# Patient Record
Sex: Male | Born: 1980 | Race: White | Hispanic: No | Smoking: Current every day smoker
Health system: Southern US, Community
[De-identification: ages and names within clinical notes are randomized; demographics above are authoritative.]

## PROBLEM LIST (undated history)

## (undated) DIAGNOSIS — E785 Hyperlipidemia, unspecified: Secondary | ICD-10-CM

## (undated) DIAGNOSIS — B019 Varicella without complication: Secondary | ICD-10-CM

## (undated) DIAGNOSIS — I10 Essential (primary) hypertension: Secondary | ICD-10-CM

## (undated) HISTORY — DX: Hyperlipidemia, unspecified: E78.5

## (undated) HISTORY — DX: Varicella without complication: B01.9

## (undated) HISTORY — DX: Essential (primary) hypertension: I10

## (undated) HISTORY — PX: TONSILLECTOMY: SUR1361

---

## 2013-08-29 ENCOUNTER — Ambulatory Visit (HOSPITAL_COMMUNITY): Payer: Self-pay | Admitting: Psychology

## 2013-09-21 ENCOUNTER — Ambulatory Visit (INDEPENDENT_AMBULATORY_CARE_PROVIDER_SITE_OTHER): Payer: 59 | Admitting: Psychology

## 2013-09-21 DIAGNOSIS — F3289 Other specified depressive episodes: Secondary | ICD-10-CM

## 2013-09-21 DIAGNOSIS — F411 Generalized anxiety disorder: Secondary | ICD-10-CM

## 2013-09-21 DIAGNOSIS — F0630 Mood disorder due to known physiological condition, unspecified: Secondary | ICD-10-CM

## 2013-09-21 DIAGNOSIS — F329 Major depressive disorder, single episode, unspecified: Secondary | ICD-10-CM

## 2013-10-04 ENCOUNTER — Encounter (HOSPITAL_COMMUNITY): Payer: Self-pay | Admitting: Psychology
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
## 2013-10-17 ENCOUNTER — Ambulatory Visit (HOSPITAL_COMMUNITY): Payer: 59 | Admitting: Psychology

## 2014-04-18 ENCOUNTER — Emergency Department (HOSPITAL_COMMUNITY)
Admission: EM | Admit: 2014-04-18 | Discharge: 2014-04-18 | Disposition: A | Discharge status: Home / Self Care | Payer: 59 | Attending: Emergency Medicine | Admitting: Emergency Medicine

## 2014-04-18 ENCOUNTER — Encounter (HOSPITAL_COMMUNITY): Payer: Self-pay | Admitting: Emergency Medicine

## 2014-04-18 DIAGNOSIS — Z79899 Other long term (current) drug therapy: Secondary | ICD-10-CM | POA: Insufficient documentation

## 2014-04-18 DIAGNOSIS — M5441 Lumbago with sciatica, right side: Secondary | ICD-10-CM | POA: Insufficient documentation

## 2014-04-18 DIAGNOSIS — Z7952 Long term (current) use of systemic steroids: Secondary | ICD-10-CM | POA: Insufficient documentation

## 2014-04-18 DIAGNOSIS — Z720 Tobacco use: Secondary | ICD-10-CM | POA: Diagnosis not present

## 2014-04-18 DIAGNOSIS — Z8619 Personal history of other infectious and parasitic diseases: Secondary | ICD-10-CM | POA: Diagnosis not present

## 2014-04-18 DIAGNOSIS — M545 Low back pain: Secondary | ICD-10-CM | POA: Diagnosis present

## 2014-04-18 MED ORDER — OXYCODONE-ACETAMINOPHEN 5-325 MG PO TABS
1.0000 | ORAL_TABLET | Freq: Once | ORAL | Status: AC
  Administered 2014-04-18: 1 via ORAL
  Filled 2014-04-18: qty 1

## 2014-04-18 MED ORDER — PREDNISONE 10 MG PO TABS: ORAL_TABLET | ORAL | Status: DC

## 2014-04-18 MED ORDER — CYCLOBENZAPRINE HCL 10 MG PO TABS
10.0000 mg | ORAL_TABLET | Freq: Once | ORAL | Status: AC
  Administered 2014-04-18: 10 mg via ORAL
  Filled 2014-04-18: qty 1

## 2014-04-18 MED ORDER — KETOROLAC TROMETHAMINE 60 MG/2ML IM SOLN
60.0000 mg | Freq: Once | INTRAMUSCULAR | Status: AC
  Administered 2014-04-18: 60 mg via INTRAMUSCULAR
  Filled 2014-04-18: qty 2

## 2014-04-18 MED ORDER — OXYCODONE-ACETAMINOPHEN 5-325 MG PO TABS: 1.0000 | ORAL_TABLET | ORAL | Status: DC | PRN

## 2014-04-18 MED ORDER — CYCLOBENZAPRINE HCL 10 MG PO TABS: 10.0000 mg | ORAL_TABLET | Freq: Three times a day (TID) | ORAL | Status: DC | PRN

## 2014-04-18 NOTE — Discharge Instructions (Signed)
Sciatica °Sciatica is pain, weakness, numbness, or tingling along your sciatic nerve. The nerve starts in the lower back and runs down the back of each leg. Nerve damage or certain conditions pinch or put pressure on the sciatic nerve. This causes the pain, weakness, and other discomforts of sciatica. °HOME CARE  °· Only take medicine as told by your doctor. °· Apply ice to the affected area for 20 minutes. Do this 3-4 times a day for the first 48-72 hours. Then try heat in the same way. °· Exercise, stretch, or do your usual activities if these do not make your pain worse. °· Go to physical therapy as told by your doctor. °· Keep all doctor visits as told. °· Do not wear high heels or shoes that are not supportive. °· Get a firm mattress if your mattress is too soft to lessen pain and discomfort. °GET HELP RIGHT AWAY IF:  °· You cannot control when you poop (bowel movement) or pee (urinate). °· You have more weakness in your lower back, lower belly (pelvis), butt (buttocks), or legs. °· You have redness or puffiness (swelling) of your back. °· You have a burning feeling when you pee. °· You have pain that gets worse when you lie down. °· You have pain that wakes you from your sleep. °· Your pain is worse than past pain. °· Your pain lasts longer than 4 weeks. °· You are suddenly losing weight without reason. °MAKE SURE YOU:  °· Understand these instructions. °· Will watch this condition. °· Will get help right away if you are not doing well or get worse. °Document Released: 03/31/2008 Document Revised: 12/22/2011 Document Reviewed: 11/01/2011 °ExitCare® Patient Information ©2015 [employer]. This information is not intended to replace advice given to you by your health care provider. Make sure you discuss any questions you have with your health care provider. ° °

## 2014-04-18 NOTE — ED Notes (Signed)
nad noted prior to dc. Dc instructions reviewed and explained. Voiced understanding. 3 Rx given to pt. Ambulated out without difficulty

## 2014-04-18 NOTE — ED Notes (Signed)
Pt c/o pain in lower back radiating down r leg.  Says right leg feels tingly at times.  No loss of control of bowel or bladder.  Says is having muscle spasms in back as well.

## 2014-04-20 NOTE — ED Provider Notes (Signed)
CSN: [id number]     Arrival date & time 04/18/14  1623 History   First MD Initiated Contact with Patient 04/18/14 1746     Chief Complaint  Patient presents with  . Back Pain     (Consider location/radiation/quality/duration/timing/severity/associated sxs/prior Treatment) HPI   Donald Cain is a 33 y.o. male who presents to the Emergency Department complaining of right sided low back pain for several days.  He states the he does a lot of lifting and bending at his job which may have contributed to his pain.  He states that pain is present regardless of his position and radiates into his right leg.  He also reports a tingling sensation to his leg that comes and goes.  He denies fever, chills, incontinence of bladder or bowel, dysuria, or abdominal pain.  He has tried OTC medications w/o relief.    Past Medical History  Diagnosis Date  . Chickenpox   . High blood pressure    Past Surgical History  Procedure Laterality Date  . Tonsillectomy     Family History  Problem Relation Age of Onset  . Alcohol abuse Father    History  Substance Use Topics  . Smoking status: Current Every Day Smoker  . Smokeless tobacco: Never Used  . Alcohol Use: No    Review of Systems  Constitutional: Negative for fever.  Respiratory: Negative for shortness of breath.   Gastrointestinal: Negative for vomiting, abdominal pain and constipation.  Genitourinary: Negative for dysuria, hematuria, flank pain, decreased urine volume and difficulty urinating.       Low back pain  Musculoskeletal: Positive for back pain. Negative for joint swelling.  Skin: Negative for rash.  Neurological: Negative for weakness and numbness.  All other systems reviewed and are negative.     Allergies  Review of patient's allergies indicates no known allergies.  Home Medications   Prior to Admission medications   Medication Sig Start Date End Date Taking? Authorizing Provider  citalopram (CELEXA) 20 MG tablet  Take 20 mg by mouth daily.   Yes Historical Provider, MD  lisinopril-hydrochlorothiazide (PRINZIDE,ZESTORETIC) 10-12.5 MG per tablet Take 1 tablet by mouth daily.   Yes Historical Provider, MD  traZODone (DESYREL) 50 MG tablet Take 50 mg by mouth at bedtime.   Yes Historical Provider, MD  cyclobenzaprine (FLEXERIL) 10 MG tablet Take 1 tablet (10 mg total) by mouth 3 (three) times daily as needed. 04/18/14   Emy Angevine L. Ramal Eckhardt, PA-C  oxyCODONE-acetaminophen (PERCOCET/ROXICET) 5-325 MG per tablet Take 1 tablet by mouth every 4 (four) hours as needed. 04/18/14   Alanni Vader L. Diya Gervasi, PA-C  predniSONE (DELTASONE) 10 MG tablet Take 6 tablets day one, 5 tablets day two, 4 tablets day three, 3 tablets day four, 2 tablets day five, then 1 tablet day six 04/18/14   Zaquan Duffner L. Wrigley Plasencia, PA-C   BP 131/80  Pulse 68  Temp(Src) 97.9 F (36.6 C) (Oral)  Resp 20  Ht 6' (1.829 m)  Wt 225 lb (102.059 kg)  BMI 30.51 kg/m2  SpO2 99% Physical Exam  Nursing note and vitals reviewed. Constitutional: He is oriented to person, place, and time. He appears well-developed and well-nourished. No distress.  HENT:  Head: Normocephalic and atraumatic.  Neck: Normal range of motion. Neck supple.  Cardiovascular: Normal rate, regular rhythm, normal heart sounds and intact distal pulses.   No murmur heard. Pulmonary/Chest: Effort normal and breath sounds normal. No respiratory distress.  Abdominal: Soft. He exhibits no distension. There is no tenderness.  Musculoskeletal: He exhibits tenderness. He exhibits no edema.       Lumbar back: He exhibits tenderness and pain. He exhibits normal range of motion, no swelling, no deformity, no laceration and normal pulse.  ttp of the right lumbar paraspinal muscles and SI joint.  No spinal tenderness.  DP pulses are brisk and symmetrical.  Distal sensation intact.  Hip Flexors/Extensors are intact.  Pt has 5/5 strength against resistance of bilateral lower extremities.     Neurological:  He is alert and oriented to person, place, and time. He has normal strength. No sensory deficit. He exhibits normal muscle tone. Coordination and gait normal.  Reflex Scores:      Patellar reflexes are 2+ on the right side and 2+ on the left side.      Achilles reflexes are 2+ on the right side and 2+ on the left side. Skin: Skin is warm and dry. No rash noted.    ED Course  Procedures (including critical care time) Labs Review Labs Reviewed - No data to display  Imaging Review No results found.   EKG Interpretation None      MDM   Final diagnoses:  Right-sided low back pain with right-sided sciatica    Pt is ambulatory, well appearing.  No focal neuro deficits..  No concerning sx's for emergent neurological or infectious process.  Likely sciatica.  He agrees to close f/u with his PMD.  Referral info given.  Rx for flexeril, percocet and prednisone taper.      Steve Youngberg L. Trisha Mangleriplett, PA-C 04/20/14 1554

## 2014-04-22 NOTE — ED Provider Notes (Signed)
Medical screening examination/treatment/procedure(s) were performed by non-physician practitioner and as supervising physician I was immediately available for consultation/collaboration.   EKG Interpretation None        Camesha Farooq L Waylynn Benefiel, MD 04/22/14 0021 

## 2014-04-27 ENCOUNTER — Other Ambulatory Visit (HOSPITAL_COMMUNITY): Payer: Self-pay | Admitting: Family Medicine

## 2014-04-27 DIAGNOSIS — N5089 Other specified disorders of the male genital organs: Secondary | ICD-10-CM

## 2014-04-27 DIAGNOSIS — M5441 Lumbago with sciatica, right side: Secondary | ICD-10-CM

## 2014-05-02 ENCOUNTER — Ambulatory Visit (HOSPITAL_COMMUNITY)
Admission: RE | Admit: 2014-05-02 | Discharge: 2014-05-02 | Disposition: A | Discharge status: Home / Self Care | Payer: 59 | Source: Ambulatory Visit | Attending: Family Medicine | Admitting: Family Medicine

## 2014-05-02 DIAGNOSIS — M545 Low back pain: Secondary | ICD-10-CM | POA: Diagnosis present

## 2014-05-02 DIAGNOSIS — N5089 Other specified disorders of the male genital organs: Secondary | ICD-10-CM

## 2014-05-02 DIAGNOSIS — M5441 Lumbago with sciatica, right side: Secondary | ICD-10-CM

## 2014-05-02 DIAGNOSIS — G8929 Other chronic pain: Secondary | ICD-10-CM | POA: Diagnosis not present

## 2014-05-02 DIAGNOSIS — M5126 Other intervertebral disc displacement, lumbar region: Secondary | ICD-10-CM | POA: Insufficient documentation

## 2014-05-02 DIAGNOSIS — M4806 Spinal stenosis, lumbar region: Secondary | ICD-10-CM | POA: Insufficient documentation

## 2014-12-07 IMAGING — US US SCROTUM
1 series · 14 of 25 positions shown · non-contrast
Comparison: None.

CLINICAL DATA: Right scrotal mass.

EXAM:
SCROTAL ULTRASOUND
DOPPLER ULTRASOUND OF THE TESTICLES
TECHNIQUE: Complete ultrasound examination of the testicles, epididymis, and
other scrotal structures was performed. Color and spectral Doppler
ultrasound were also utilized to evaluate blood flow to the
testicles.

[Series 1: us scrotum · 0.05mm/px · 14 of 97 slices shown]
[im 1/97]
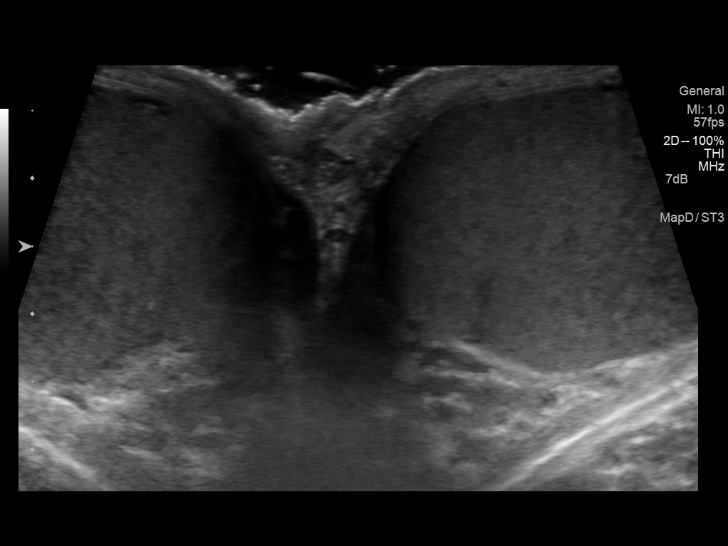
[im 9/97]
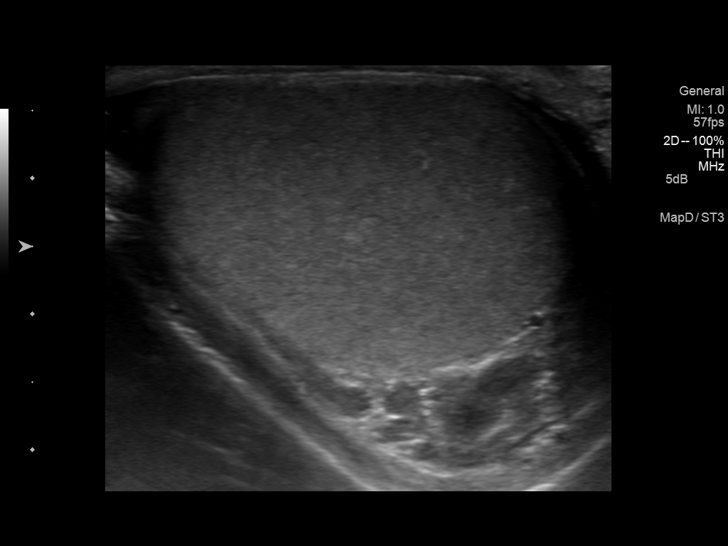
[im 17/97]
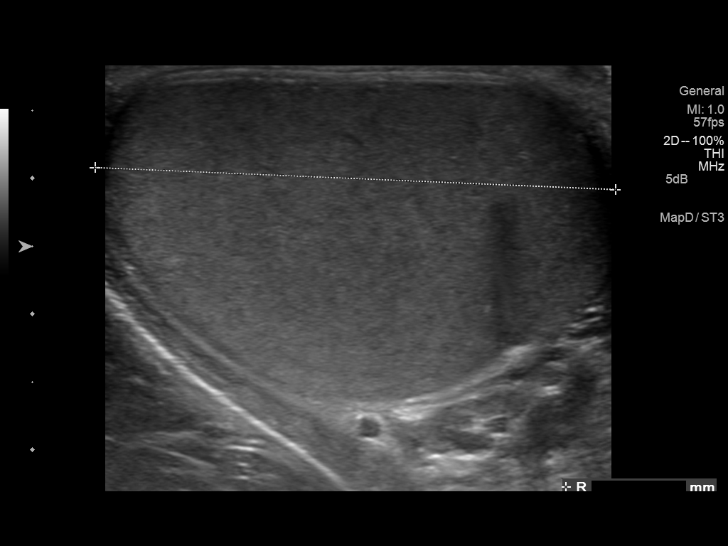
[im 25/97]
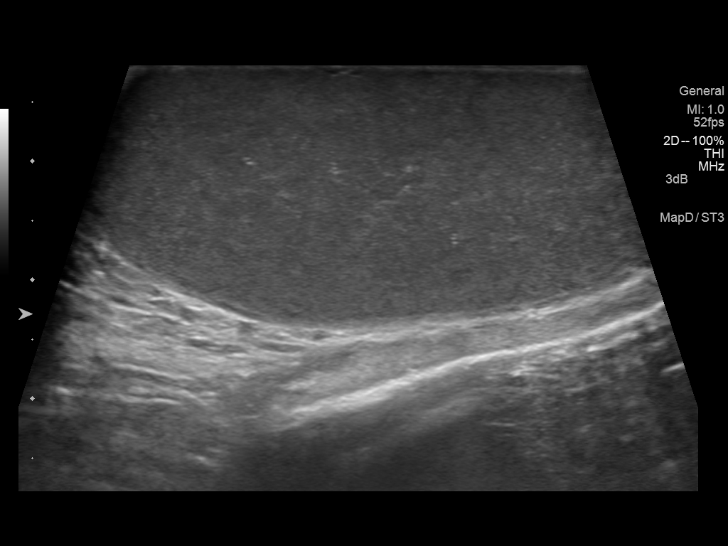
[im 33/97]
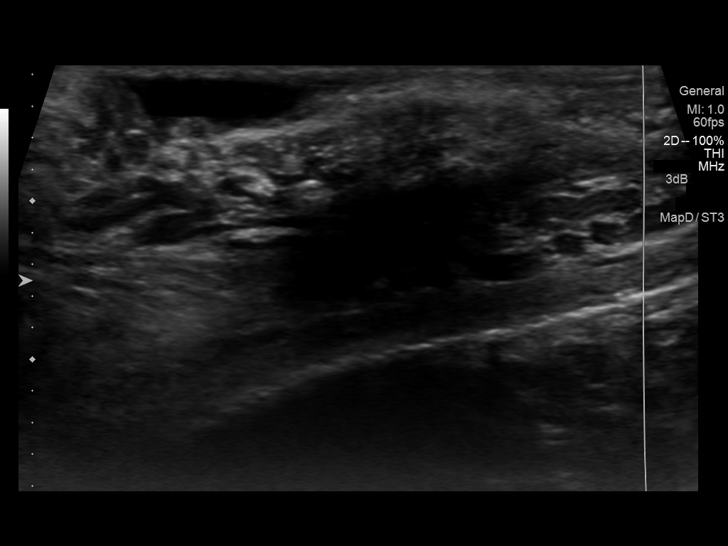
[im 37/97]
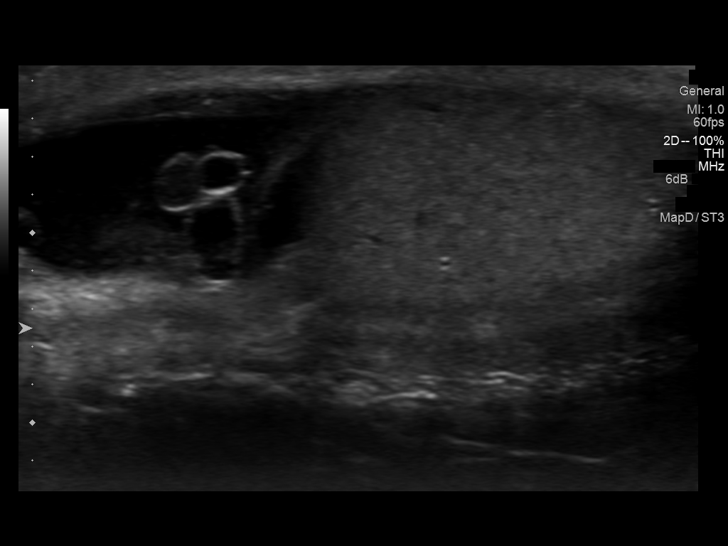
[im 45/97]
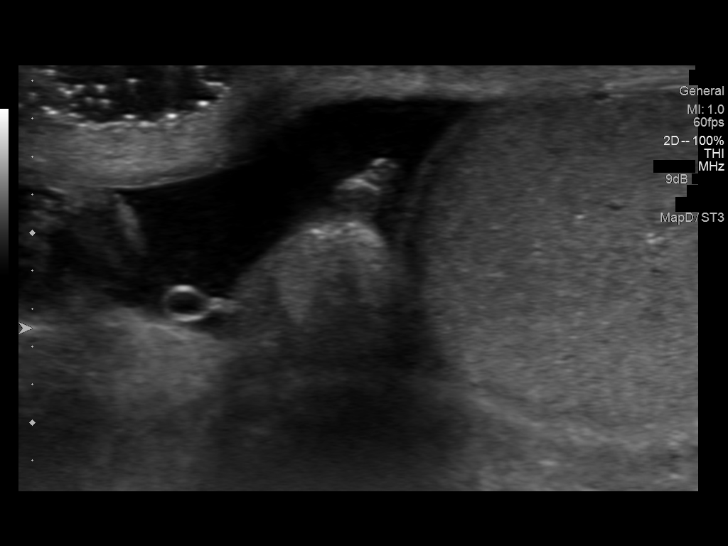
[im 53/97]
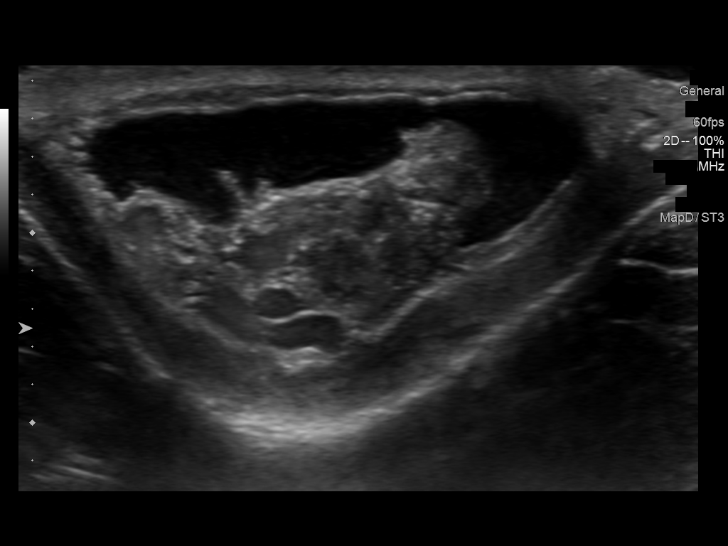
[im 61/97]
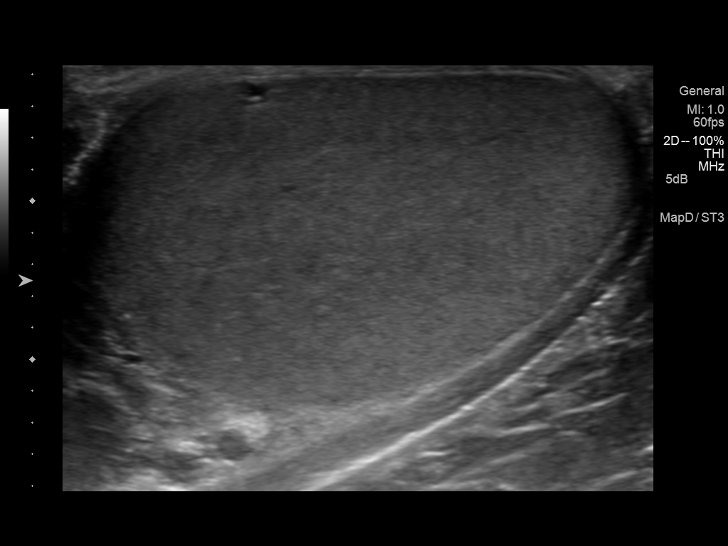
[im 65/97]
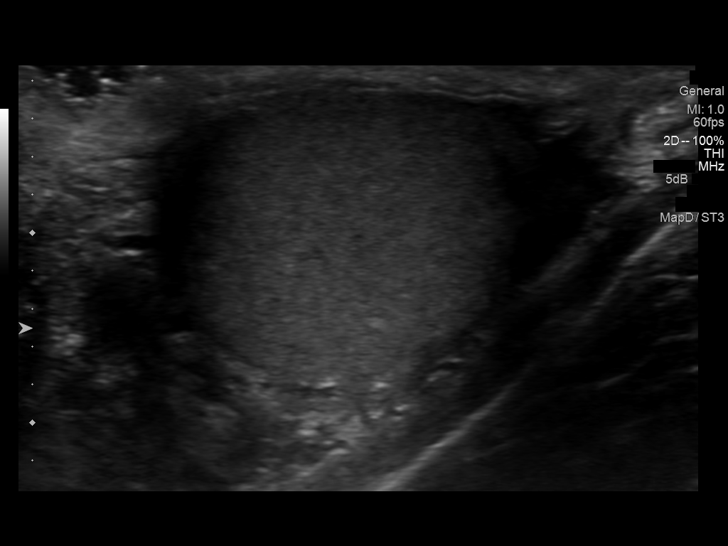
[im 73/97]
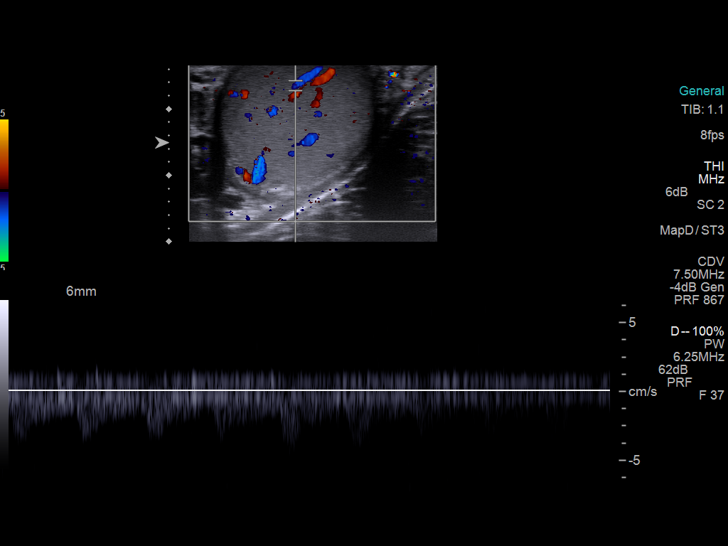
[im 81/97]
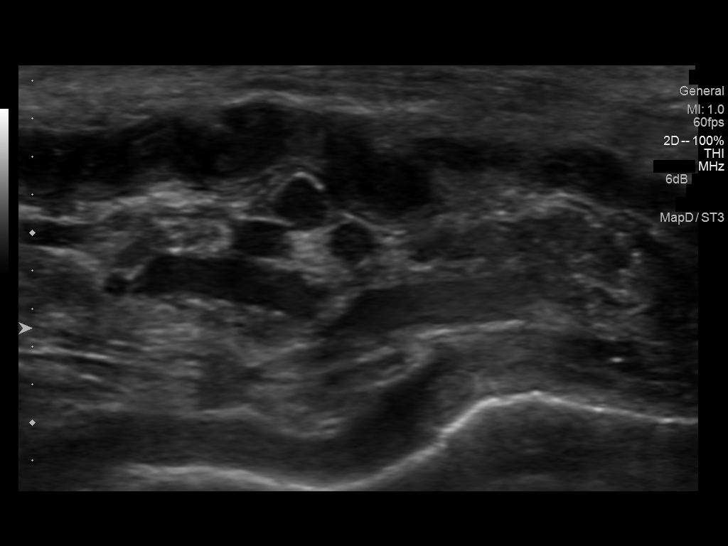
[im 89/97]
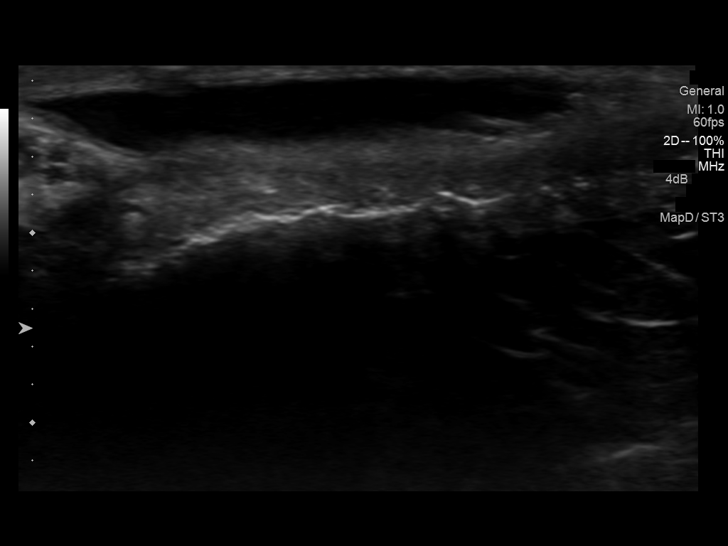
[im 97/97]
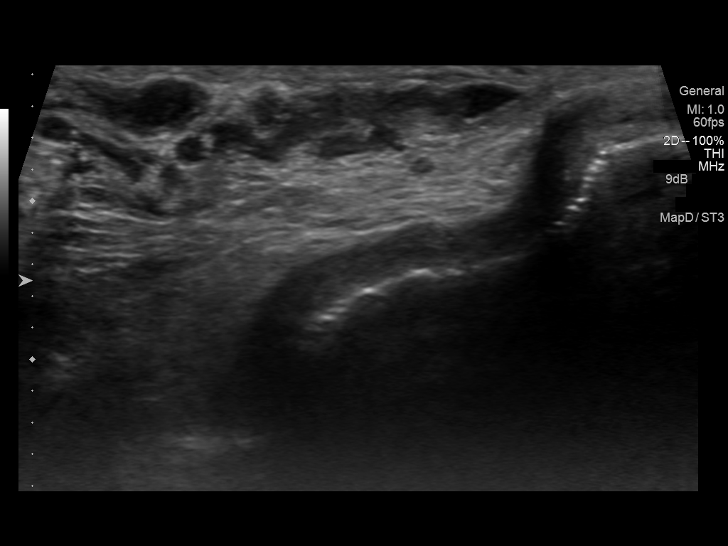

[14 of 25 positions shown; findings below may reference images not displayed]

FINDINGS: Right testicle

Measurements: 4.8 x 2.2 x 3.8 cm. No mass or microlithiasis
visualized.

Left testicle

Measurements: 4.3 x 2.2 x 3.5 cm. No mass or microlithiasis
visualized.

Right epididymis:  Approximately 5 mm epididymal cyst.

Left epididymis:  Normal in size and appearance.

Hydrocele:  Bilateral small to moderate hydroceles.

Varicocele:  Bilateral probable small a moderate sized varicoceles.

Pulsed Doppler interrogation of both testes demonstrates low
resistance arterial and venous waveforms bilaterally.
IMPRESSION: 1. Bilateral varicoceles.
2. Bilateral hydroceles.
3. Small 5 mm right epididymal cyst.
No acute abnormality. Specifically no evidence of testicular
torsion.

## 2016-01-02 ENCOUNTER — Ambulatory Visit (INDEPENDENT_AMBULATORY_CARE_PROVIDER_SITE_OTHER): Payer: Self-pay | Admitting: Family Medicine

## 2016-01-02 ENCOUNTER — Encounter: Payer: Self-pay | Admitting: Family Medicine

## 2016-01-02 VITALS — BP 132/84 | Ht 71.5 in | Wt 239.6 lb

## 2016-01-02 DIAGNOSIS — I10 Essential (primary) hypertension: Secondary | ICD-10-CM

## 2016-01-02 DIAGNOSIS — F411 Generalized anxiety disorder: Secondary | ICD-10-CM

## 2016-01-02 MED ORDER — LISINOPRIL-HYDROCHLOROTHIAZIDE 10-12.5 MG PO TABS: 1.0000 | ORAL_TABLET | Freq: Every day | ORAL | Status: DC

## 2016-01-02 MED ORDER — SERTRALINE HCL 100 MG PO TABS: 100.0000 mg | ORAL_TABLET | Freq: Every day | ORAL | Status: DC

## 2016-01-02 NOTE — Progress Notes (Signed)
   Subjective:    Patient ID: Donald Cain, male    DOB: 06/10/1981, 35 y.o.   MRN: [id number]  HPI Patient arrives with c/o headache, fatigue and dizziness, for one week. Patient states he has a history of anxiety and panic attacks.  Dizzy an d fatigued  BP med regulalry ,  Does not miss a dose, geernally numbers are good  Hx of bp elev to 180 ovr 105 cme down to pretty normal  Smokes a ppd  Frontal to nuchal, no fam hx of high bp   Started celexa, after awhile did not work, Now on [employer]  Fleet managrer  Not much exercise at this time    Busy with job    Review of Systems No headache, no major weight loss or weight gain, no chest pain no back pain abdominal pain no change in bowel habits complete ROS otherwise negative     Objective:   Physical Exam  Alert no acute distress vital stable blood pressure excellent on repeat 128/82. Lungs clear heart rare rhythm neck supple      Assessment & Plan:  Impression headache with insomnia on further history patient's had a lot of stress at work about a week and a half go ahead and substantial misunderstanding with his boss which almost led to him and fired. #2 hypertension actually good control when checked here #3 history of anxiety with element of depression plan maintain exact same medicines. Diet exercise discussed. Patient states will be getting insurance soon so recommend follow-up along with chronic assessment and blood work then [employer]

## 2016-01-03 DIAGNOSIS — I10 Essential (primary) hypertension: Secondary | ICD-10-CM | POA: Insufficient documentation

## 2016-01-03 DIAGNOSIS — F411 Generalized anxiety disorder: Secondary | ICD-10-CM | POA: Insufficient documentation

## 2016-02-12 ENCOUNTER — Ambulatory Visit (INDEPENDENT_AMBULATORY_CARE_PROVIDER_SITE_OTHER): Payer: 59 | Admitting: Nurse Practitioner

## 2016-02-12 ENCOUNTER — Encounter: Payer: Self-pay | Admitting: Nurse Practitioner

## 2016-02-12 VITALS — BP 122/80 | Temp 98.5°F | Ht 71.5 in | Wt 239.0 lb

## 2016-02-12 DIAGNOSIS — H66001 Acute suppurative otitis media without spontaneous rupture of ear drum, right ear: Secondary | ICD-10-CM

## 2016-02-12 DIAGNOSIS — F17200 Nicotine dependence, unspecified, uncomplicated: Secondary | ICD-10-CM

## 2016-02-12 DIAGNOSIS — J329 Chronic sinusitis, unspecified: Secondary | ICD-10-CM

## 2016-02-12 MED ORDER — METHYLPREDNISOLONE ACETATE 40 MG/ML IJ SUSP
40.0000 mg | Freq: Once | INTRAMUSCULAR | Status: AC
  Administered 2016-02-12: 40 mg via INTRAMUSCULAR

## 2016-02-12 MED ORDER — PREDNISONE 20 MG PO TABS: ORAL_TABLET | ORAL | 0 refills | Status: DC

## 2016-02-12 MED ORDER — AZITHROMYCIN 250 MG PO TABS: ORAL_TABLET | ORAL | 0 refills | Status: DC

## 2016-02-13 ENCOUNTER — Encounter: Payer: Self-pay | Admitting: Nurse Practitioner

## 2016-02-13 NOTE — Progress Notes (Signed)
Subjective:  Presents for complaints of sinus area headache and constant pressure for the past 3 days. Mainly around the ethmoid and sphenoid area. No relief with Excedrin or Aleve. No fever sore throat or cough. No ear pain but having trouble hearing in the right ear. This is been going on for while. No visual changes. Nausea but no vomiting. Headache better with rest. Sleeping without difficulty, no headache at nighttime. Within 30 minutes of getting up in the morning the headache starts back. Has a history of poor dentition, no toothache at this time. Has not identified any worsening factors. BP has been running well outside of the office. Smokes one pack per day. Chews about one third pack of tobacco per day as well. Denies any oral lesions or sores. No numbness or weakness of the face arms or legs. No difficulty speaking or swallowing.  Objective:   BP 122/80   Temp 98.5 F (36.9 C) (Oral)   Ht 5' 11.5" (1.816 m)   Wt 239 lb (108.4 kg)   BMI 32.87 kg/m  NAD. Alert, oriented. TMs extremely retracted bilateral, right TM significant erythema. Pharynx injected with PND noted. Neck supple with mild soft slightly tender anterior cervical adenopathy. Normal ROM of the neck. Lungs clear. Heart regular rate rhythm.  Assessment: Rhinosinusitis - Plan: methylPREDNISolone acetate (DEPO-MEDROL) injection 40 mg  Tobacco use disorder  Acute suppurative otitis media of right ear without spontaneous rupture of tympanic membrane, recurrence not specified  Plan:  Meds ordered this encounter  Medications  . UNABLE TO FIND    Sig: Alpha Plus weight loss supplement.  . predniSONE (DELTASONE) 20 MG tablet    Sig: 3 po qd x 3 d then 2 po qd x 3 d then 1 po qd x 3 d    Dispense:  18 tablet    Refill:  0    Order Specific Question:   Supervising Provider    Answer:   Merlyn AlbertLUKING, WILLIAM S [[id number]]  . azithromycin (ZITHROMAX Z-PAK) 250 MG tablet    Sig: Take 2 tablets (500 mg) on  Day 1,  followed by 1 tablet (250  mg) once daily on Days 2 through 5.    Dispense:  6 each    Refill:  0    Order Specific Question:   Supervising Provider    Answer:   Merlyn AlbertLUKING, WILLIAM S [[id number]]  . methylPREDNISolone acetate (DEPO-MEDROL) injection 40 mg   Lengthy discussion about tobacco use and risk associated with this. OTC meds as directed for congestion and cough. Callback in 48 hours if no improvement, sooner if worse. Warning signs reviewed regarding headache.

## 2016-02-20 ENCOUNTER — Ambulatory Visit (INDEPENDENT_AMBULATORY_CARE_PROVIDER_SITE_OTHER): Payer: 59 | Admitting: Family Medicine

## 2016-02-20 ENCOUNTER — Encounter: Payer: Self-pay | Admitting: Family Medicine

## 2016-02-20 VITALS — BP 112/74 | Ht 71.5 in | Wt 235.0 lb

## 2016-02-20 DIAGNOSIS — I10 Essential (primary) hypertension: Secondary | ICD-10-CM | POA: Diagnosis not present

## 2016-02-20 DIAGNOSIS — F411 Generalized anxiety disorder: Secondary | ICD-10-CM | POA: Diagnosis not present

## 2016-02-20 DIAGNOSIS — F339 Major depressive disorder, recurrent, unspecified: Secondary | ICD-10-CM | POA: Diagnosis not present

## 2016-02-20 DIAGNOSIS — Z13220 Encounter for screening for lipoid disorders: Secondary | ICD-10-CM | POA: Diagnosis not present

## 2016-02-20 MED ORDER — FLUOXETINE HCL 20 MG PO CAPS: ORAL_CAPSULE | ORAL | 3 refills | Status: DC

## 2016-02-20 MED ORDER — LISINOPRIL-HYDROCHLOROTHIAZIDE 10-12.5 MG PO TABS: 1.0000 | ORAL_TABLET | Freq: Every day | ORAL | 2 refills | Status: DC

## 2016-02-20 NOTE — Progress Notes (Signed)
   Subjective:    Patient ID: Donald Cain, male    DOB: 06/16/1981, 35 y.o.   MRN: [id number]  Anxiety     Follow up on anxiety. Taking zoloft. Does not feel like it is helping.  Patient has a history of anxiety and depression as well as having a history of feeling stressed. He is [facility]. They offered for him to see the psychiatrist he'll be a few months till they can see him. Patient has been on Zoloft for well over a year states he doesn't feel it's helping. Does not want to be on nerve pills. Denies being in suicidal homicidal. Does state he gets moderately stressed at times. Also relates he gets anxious at times. Patient history of hypertension does smoking knows he needs to quit  Review of Systems Patient denies any chest tightness pressure pain shortness breath swelling nausea vomiting    Objective:   Physical Exam  Lungs clear heart regular pulse normal extremities no edema skin warm dry      Assessment & Plan:  Patient declines any nerve medication Stop Zoloft Start Prozac 20 mg daily If suicidal or homicidal immediately seek help here or ER [facility] I've encouraged him to follow-up with their psychiatrist Recheck here in one month Check metabolic 7 lipid profile continue blood pressure medicine

## 2016-02-28 ENCOUNTER — Ambulatory Visit: Payer: Self-pay | Admitting: Family Medicine

## 2016-03-20 ENCOUNTER — Ambulatory Visit (INDEPENDENT_AMBULATORY_CARE_PROVIDER_SITE_OTHER): Payer: 59 | Admitting: Family Medicine

## 2016-03-20 ENCOUNTER — Encounter: Payer: Self-pay | Admitting: Family Medicine

## 2016-03-20 VITALS — BP 124/78 | Ht 72.0 in | Wt 236.0 lb

## 2016-03-20 DIAGNOSIS — I10 Essential (primary) hypertension: Secondary | ICD-10-CM

## 2016-03-20 DIAGNOSIS — F411 Generalized anxiety disorder: Secondary | ICD-10-CM | POA: Diagnosis not present

## 2016-03-20 MED ORDER — LISINOPRIL-HYDROCHLOROTHIAZIDE 10-12.5 MG PO TABS: 1.0000 | ORAL_TABLET | Freq: Every day | ORAL | 4 refills | Status: DC

## 2016-03-20 MED ORDER — FLUOXETINE HCL 40 MG PO CAPS: ORAL_CAPSULE | ORAL | 5 refills | Status: DC

## 2016-03-20 NOTE — Patient Instructions (Signed)
We will try a higher dose of fluoxetine -if not doing better on this or worse call and follow up sooner  Recheck here in 3 to 4 months

## 2016-03-20 NOTE — Progress Notes (Signed)
   Subjective:    Patient ID: Donald Cain, male    DOB: 10/28/1980, 35 y.o.   MRN: [id number]  Depression         This is a new problem.  Associated symptoms include no fatigue and no headaches.  Treatments tried: prozac. Patient is been having history of some depression symptoms but not suicidal. He's been having a fair amount of anxiety symptoms with a lot of stress related to work he has a very demanding job in addition to this he sometimes gets panicky he denies being suicidal or homicidal he states a new medicine seems to be helping him but he wonders if he can get a higher dose Rash all over. Usually comes up in the summer when he sweats. Itching some.    Review of Systems  Constitutional: Negative for activity change, fatigue and fever.  Respiratory: Negative for cough and shortness of breath.   Cardiovascular: Negative for chest pain and leg swelling.  Neurological: Negative for headaches.  Psychiatric/Behavioral: Positive for depression.       Objective:   Physical Exam  Constitutional: He appears well-nourished. No distress.  Cardiovascular: Normal rate, regular rhythm and normal heart sounds.   No murmur heard. Pulmonary/Chest: Effort normal and breath sounds normal. No respiratory distress.  Musculoskeletal: He exhibits no edema.  Lymphadenopathy:    He has no cervical adenopathy.  Neurological: He is alert.  Psychiatric: His behavior is normal.  Vitals reviewed.  Blood pressure doing good on medication continue medication  Follow-up 3-4 months sooner if anxiety or depression symptoms worsen     Assessment & Plan:  Chronic anxiety with some depression symptoms doing better with Prozac increase the dose 40 mg daily if having any problems with the medicine notify us.  Tinea versicolor topical OTC measures discussed.

## 2016-03-23 ENCOUNTER — Other Ambulatory Visit: Payer: Self-pay | Admitting: Family Medicine

## 2016-03-24 LAB — BASIC METABOLIC PANEL
BUN: 9 mg/dL (ref 7–25)
CALCIUM: 9.7 mg/dL (ref 8.6–10.3)
CHLORIDE: 102 mmol/L (ref 98–110)
CO2: 29 mmol/L (ref 20–31)
CREATININE: 1.11 mg/dL (ref 0.60–1.35)
Glucose, Bld: 82 mg/dL (ref 65–99)
Potassium: 3.8 mmol/L (ref 3.5–5.3)
SODIUM: 139 mmol/L (ref 135–146)

## 2016-03-24 LAB — LIPID PANEL
Cholesterol: 245 mg/dL — ABNORMAL HIGH (ref 125–200)
HDL: 45 mg/dL (ref >=40)
LDL CALC: 172 mg/dL — AB (ref <130)
Total CHOL/HDL Ratio: 5.4 Ratio — ABNORMAL HIGH (ref <=5.0)
Triglycerides: 138 mg/dL (ref <150)
VLDL: 28 mg/dL (ref <30)

## 2016-03-26 ENCOUNTER — Encounter: Payer: Self-pay | Admitting: Family Medicine

## 2016-06-19 ENCOUNTER — Ambulatory Visit: Payer: 59 | Admitting: Family Medicine

## 2016-11-13 ENCOUNTER — Ambulatory Visit (INDEPENDENT_AMBULATORY_CARE_PROVIDER_SITE_OTHER): Payer: 59 | Admitting: Family Medicine

## 2016-11-13 ENCOUNTER — Encounter: Payer: Self-pay | Admitting: Family Medicine

## 2016-11-13 VITALS — BP 120/84 | Ht 72.0 in | Wt 227.2 lb

## 2016-11-13 DIAGNOSIS — G4730 Sleep apnea, unspecified: Secondary | ICD-10-CM | POA: Diagnosis not present

## 2016-11-13 DIAGNOSIS — I10 Essential (primary) hypertension: Secondary | ICD-10-CM

## 2016-11-13 NOTE — Progress Notes (Signed)
   Subjective:    Patient ID: Donald Cain, male    DOB: 09/18/1980, 36 y.o.   MRN: [id number]  HPI Patient in today with c/o possible sleep apnea.  Patient relates a lot of daytime fatigue tiredness feeling rundown snores at night has pauses in his breathing family was concerned sent him here to the doctor for further evaluation he has history of blood pressure issues. [place] questionnaire was reviewed and positive for probable sleep apnea. States no other concerns this visit.    Review of Systems No chest tightness pressure pain or shortness of breath    Objective:   Physical Exam Lungs are clearHeart regular pulse normal BP good airway appears normal   [place] questionnaire reviewed patient at high risk for sleep apnea    Assessment & Plan:  Probable sleep apnea I recommend split protocol. Await the results.  Blood pressure continue medication  Follow-up in the fall lab work indicated

## 2016-11-26 ENCOUNTER — Encounter: Payer: Self-pay | Admitting: Family Medicine

## 2016-12-22 ENCOUNTER — Ambulatory Visit: Payer: 59 | Attending: Family Medicine

## 2016-12-22 DIAGNOSIS — Z79899 Other long term (current) drug therapy: Secondary | ICD-10-CM | POA: Insufficient documentation

## 2016-12-22 DIAGNOSIS — R0683 Snoring: Secondary | ICD-10-CM

## 2016-12-22 DIAGNOSIS — R5383 Other fatigue: Secondary | ICD-10-CM | POA: Insufficient documentation

## 2016-12-22 DIAGNOSIS — G4733 Obstructive sleep apnea (adult) (pediatric): Secondary | ICD-10-CM

## 2017-02-02 NOTE — Procedures (Unsigned)
   [facility] Baldomero Mirarchi A. Gerilyn Pilgrimoonquah, MD     [email or web address]             HOME SLEEP TEST  LOCATION: [facility]   INDICATION: This is a 36 year old who presents with snoring and fatigue.  MEDICATION:  Current Outpatient Prescriptions:  .  FLUoxetine (PROZAC) 40 MG capsule, Use 1 qd, stop zoloft, Disp: 30 capsule, Rfl: 5 .  lisinopril-hydrochlorothiazide (PRINZIDE,ZESTORETIC) 10-12.5 MG tablet, Take 1 tablet by mouth daily., Disp: 30 tablet, Rfl: 4 .  UNABLE TO FIND, Alpha Plus weight loss supplement., Disp: , Rfl:     ANALYSIS: The total monitoring time was 336 minutes. The diagnostic AHI is 4 and a low oxygen saturation 86%.      IMPRESSION: This is a unremarkable home sleep testing.  Argie RammingKofi A Sahvannah Rieser, MD Diplomate, American Board of Sleep Medicine.  ELECTRONICALLY SIGNED ON:  02/02/2017, 10:42 PM [facility] PH: [phone]   FX: [phone] ACCREDITED BY THE AMERICAN ACADEMY OF SLEEP MEDICINE

## 2017-03-11 ENCOUNTER — Telehealth: Payer: Self-pay | Admitting: Family Medicine

## 2017-03-11 DIAGNOSIS — Z79899 Other long term (current) drug therapy: Secondary | ICD-10-CM

## 2017-03-11 DIAGNOSIS — I10 Essential (primary) hypertension: Secondary | ICD-10-CM

## 2017-03-11 DIAGNOSIS — Z13220 Encounter for screening for lipoid disorders: Secondary | ICD-10-CM

## 2017-03-11 NOTE — Telephone Encounter (Signed)
Last labs 03/23/16 lipid, bmp

## 2017-03-11 NOTE — Telephone Encounter (Signed)
Pt is needing lab orders for an appt that he has coming up on the 13th. Pt is needing the orders printed due to him getting them done at [facility].

## 2017-03-12 NOTE — Telephone Encounter (Signed)
Lipid, liver, metabolic 7 

## 2017-03-12 NOTE — Telephone Encounter (Signed)
Orders printed and ready for pickup. Pt notified.

## 2017-03-18 ENCOUNTER — Ambulatory Visit: Payer: 59 | Admitting: Family Medicine

## 2017-03-23 ENCOUNTER — Encounter: Payer: Self-pay | Admitting: Family Medicine

## 2017-03-23 ENCOUNTER — Ambulatory Visit (INDEPENDENT_AMBULATORY_CARE_PROVIDER_SITE_OTHER): Payer: 59 | Admitting: Family Medicine

## 2017-03-23 VITALS — BP 112/80 | Ht 72.0 in | Wt 229.0 lb

## 2017-03-23 DIAGNOSIS — G4700 Insomnia, unspecified: Secondary | ICD-10-CM | POA: Insufficient documentation

## 2017-03-23 DIAGNOSIS — Z13220 Encounter for screening for lipoid disorders: Secondary | ICD-10-CM

## 2017-03-23 DIAGNOSIS — I10 Essential (primary) hypertension: Secondary | ICD-10-CM

## 2017-03-23 DIAGNOSIS — Z720 Tobacco use: Secondary | ICD-10-CM

## 2017-03-23 DIAGNOSIS — Z79899 Other long term (current) drug therapy: Secondary | ICD-10-CM | POA: Diagnosis not present

## 2017-03-23 MED ORDER — BUPROPION HCL ER (SR) 150 MG PO TB12: 150.0000 mg | ORAL_TABLET | Freq: Two times a day (BID) | ORAL | 2 refills | Status: DC

## 2017-03-23 MED ORDER — FLUOXETINE HCL 40 MG PO CAPS: ORAL_CAPSULE | ORAL | 5 refills | Status: DC

## 2017-03-23 MED ORDER — LISINOPRIL-HYDROCHLOROTHIAZIDE 10-12.5 MG PO TABS: 1.0000 | ORAL_TABLET | Freq: Every day | ORAL | 5 refills | Status: DC

## 2017-03-23 MED ORDER — TRAZODONE HCL 50 MG PO TABS: 25.0000 mg | ORAL_TABLET | Freq: Every evening | ORAL | 3 refills | Status: DC | PRN

## 2017-03-23 NOTE — Progress Notes (Signed)
   Subjective:    Patient ID: Donald Cain, male    DOB: 11-Jun-1981, 36 y.o.   MRN: [id number]  Hypertension  This is a recurrent problem. The current episode started more than 1 year ago. Pertinent negatives include no chest pain, headaches or shortness of breath.   This patient relates that he is under a fair amount of stress feels like it's difficult for him to get enough sleep or rest. He denies being suicidal denies being homicidal. Distress is mainly at work otherwise he is trying to do well. Patient is on Lisinopril 10/12.5 one daily.Patient state he does not eat healthy,but works out daily from work. Needs refills today. Patient does smoke he is interested in quitting smoking He also has a hard time sleeping at night but does not want to be on anything that could affect him during the day    Review of Systems  Constitutional: Negative for activity change, fatigue and fever.  Respiratory: Negative for cough and shortness of breath.   Cardiovascular: Negative for chest pain and leg swelling.  Neurological: Negative for headaches.       Objective:   Physical Exam  Constitutional: He appears well-nourished. No distress.  Cardiovascular: Normal rate, regular rhythm and normal heart sounds.   No murmur heard. Pulmonary/Chest: Effort normal and breath sounds normal. No respiratory distress.  Musculoskeletal: He exhibits no edema.  Lymphadenopathy:    He has no cervical adenopathy.  Neurological: He is alert.  Psychiatric: His behavior is normal.  Vitals reviewed.         Assessment & Plan:  Insomnia-I think trazodone would be the best choice one half-1 tablet-of a 50 mg tablet-use this at bedtime hopefully this will help  HTN blood pressure recheck good patient will do his lab work soon continue current medication  Patient was encouraged to quit smoking try Wellbutrin this should help his stress as well as his smoking issues. To follow-up if ongoing  troubles  Recheck 6 months

## 2017-03-23 NOTE — Patient Instructions (Addendum)
Steps to Quit Smoking Smoking tobacco can be harmful to your health and can affect almost every organ in your body. Smoking puts you, and those around you, at risk for developing many serious chronic diseases. Quitting smoking is difficult, but it is one of the best things that you can do for your health. It is never too late to quit. What are the benefits of quitting smoking? When you quit smoking, you lower your risk of developing serious diseases and conditions, such as:  Lung cancer or lung disease, such as COPD.  Heart disease.  Stroke.  Heart attack.  Infertility.  Osteoporosis and bone fractures.  Additionally, symptoms such as coughing, wheezing, and shortness of breath may get better when you quit. You may also find that you get sick less often because your body is stronger at fighting off colds and infections. If you are pregnant, quitting smoking can help to reduce your chances of having a baby of low birth weight. How do I get ready to quit? When you decide to quit smoking, create a plan to make sure that you are successful. Before you quit:  Pick a date to quit. Set a date within the next two weeks to give you time to prepare.  Write down the reasons why you are quitting. Keep this list in places where you will see it often, such as on your bathroom mirror or in your car or wallet.  Identify the people, places, things, and activities that make you want to smoke (triggers) and avoid them. Make sure to take these actions: ? Throw away all cigarettes at home, at work, and in your car. ? Throw away smoking accessories, such as Scientist, research (medical). ? Clean your car and make sure to empty the ashtray. ? Clean your home, including curtains and carpets.  Tell your family, friends, and coworkers that you are quitting. Support from your loved ones can make quitting easier.  Talk with your health care provider about your options for quitting smoking.  Find out what treatment  options are covered by your health insurance.  What strategies can I use to quit smoking? Talk with your healthcare provider about different strategies to quit smoking. Some strategies include:  Quitting smoking altogether instead of gradually lessening how much you smoke over a period of time. Research shows that quitting "cold [place]" is more successful than gradually quitting.  Attending in-person counseling to help you build problem-solving skills. You are more likely to have success in quitting if you attend several counseling sessions. Even short sessions of 10 minutes can be effective.  Finding resources and support systems that can help you to quit smoking and remain smoke-free after you quit. These resources are most helpful when you use them often. They can include: ? Online chats with a Social worker. ? Telephone quitlines. ? Careers information officer. ? Support groups or group counseling. ? Text messaging programs. ? Mobile phone applications.  Taking medicines to help you quit smoking. (If you are pregnant or breastfeeding, talk with your health care provider first.) Some medicines contain nicotine and some do not. Both types of medicines help with cravings, but the medicines that include nicotine help to relieve withdrawal symptoms. Your health care provider may recommend: ? Nicotine patches, gum, or lozenges. ? Nicotine inhalers or sprays. ? Non-nicotine medicine that is taken by mouth.  Talk with your health care provider about combining strategies, such as taking medicines while you are also receiving in-person counseling. Using these two strategies together  makes you more likely to succeed in quitting than if you used either strategy on its own. If you are pregnant or breastfeeding, talk with your health care provider about finding counseling or other support strategies to quit smoking. Do not take medicine to help you quit smoking unless told to do so by your health care  provider. What things can I do to make it easier to quit? Quitting smoking might feel overwhelming at first, but there is a lot that you can do to make it easier. Take these important actions:  Reach out to your family and friends and ask that they support and encourage you during this time. Call telephone quitlines, reach out to support groups, or work with a counselor for support.  Ask people who smoke to avoid smoking around you.  Avoid places that trigger you to smoke, such as bars, parties, or smoke-break areas at work.  Spend time around people who do not smoke.  Lessen stress in your life, because stress can be a smoking trigger for some people. To lessen stress, try: ? Exercising regularly. ? Deep-breathing exercises. ? Yoga. ? Meditating. ? Performing a body scan. This involves closing your eyes, scanning your body from head to toe, and noticing which parts of your body are particularly tense. Purposefully relax the muscles in those areas.  Download or purchase mobile phone or tablet apps (applications) that can help you stick to your quit plan by providing reminders, tips, and encouragement. There are many free apps, such as QuitGuide from the [employer] Office manager for Disease Control and Prevention). You can find other support for quitting smoking (smoking cessation) through smokefree.gov and other websites.  How will I feel when I quit smoking? Within the first 24 hours of quitting smoking, you may start to feel some withdrawal symptoms. These symptoms are usually most noticeable 2-3 days after quitting, but they usually do not last beyond 2-3 weeks. Changes or symptoms that you might experience include:  Mood swings.  Restlessness, anxiety, or irritation.  Difficulty concentrating.  Dizziness.  Strong cravings for sugary foods in addition to nicotine.  Mild weight gain.  Constipation.  Nausea.  Coughing or a sore throat.  Changes in how your medicines work in your  body.  A depressed mood.  Difficulty sleeping (insomnia).  After the first 2-3 weeks of quitting, you may start to notice more positive results, such as:  Improved sense of smell and taste.  Decreased coughing and sore throat.  Slower heart rate.  Lower blood pressure.  Clearer skin.  The ability to breathe more easily.  Fewer sick days.  Quitting smoking is very challenging for most people. Do not get discouraged if you are not successful the first time. Some people need to make many attempts to quit before they achieve long-term success. Do your best to stick to your quit plan, and talk with your health care provider if you have any questions or concerns. This information is not intended to replace advice given to you by your health care provider. Make sure you discuss any questions you have with your health care provider. Document Released: 06/16/2001 Document Revised: 02/18/2016 Document Reviewed: 11/06/2014 Elsevier Interactive Patient Education  2017 [place] and Stress Management Stress is a normal reaction to life events. It is what you feel when life demands more than you are used to or more than you can handle. Some stress can be useful. For example, the stress reaction can help you catch the last bus of  the day, study for a test, or meet a deadline at work. But stress that occurs too often or for too long can cause problems. It can affect your emotional health and interfere with relationships and normal daily activities. Too much stress can weaken your immune system and increase your risk for physical illness. If you already have a medical problem, stress can make it worse. What are the causes? All sorts of life events may cause stress. An event that causes stress for one person may not be stressful for another person. Major life events commonly cause stress. These may be positive or negative. Examples include losing your job, moving into a new home, getting  married, having a baby, or losing a loved one. Less obvious life events may also cause stress, especially if they occur day after day or in combination. Examples include working long hours, driving in traffic, caring for children, being in debt, or being in a difficult relationship. What are the signs or symptoms? Stress may cause emotional symptoms including, the following:  Anxiety. This is feeling worried, afraid, on edge, overwhelmed, or out of control.  Anger. This is feeling irritated or impatient.  Depression. This is feeling sad, down, helpless, or guilty.  Difficulty focusing, remembering, or making decisions.  Stress may cause physical symptoms, including the following:  Aches and pains. These may affect your head, neck, back, stomach, or other areas of your body.  Tight muscles or clenched jaw.  Low energy or trouble sleeping.  Stress may cause unhealthy behaviors, including the following:  Eating to feel better (overeating) or skipping meals.  Sleeping too little, too much, or both.  Working too much or putting off tasks (procrastination).  Smoking, drinking alcohol, or using drugs to feel better.  How is this diagnosed? Stress is diagnosed through an assessment by your health care provider. Your health care provider will ask questions about your symptoms and any stressful life events.Your health care provider will also ask about your medical history and may order blood tests or other tests. Certain medical conditions and medicine can cause physical symptoms similar to stress. Mental illness can cause emotional symptoms and unhealthy behaviors similar to stress. Your health care provider may refer you to a mental health professional for further evaluation. How is this treated? Stress management is the recommended treatment for stress.The goals of stress management are reducing stressful life events and coping with stress in healthy ways. Techniques for reducing  stressful life events include the following:  Stress identification. Self-monitor for stress and identify what causes stress for you. These skills may help you to avoid some stressful events.  Time management. Set your priorities, keep a calendar of events, and learn to say "no." These tools can help you avoid making too many commitments.  Techniques for coping with stress include the following:  Rethinking the problem. Try to think realistically about stressful events rather than ignoring them or overreacting. Try to find the positives in a stressful situation rather than focusing on the negatives.  Exercise. Physical exercise can release both physical and emotional tension. The key is to find a form of exercise you enjoy and do it regularly.  Relaxation techniques. These relax the body and mind. Examples include yoga, meditation, tai chi, biofeedback, deep breathing, progressive muscle relaxation, listening to music, being out in nature, journaling, and other hobbies. Again, the key is to find one or more that you enjoy and can do regularly.  Healthy lifestyle. Eat a balanced diet, get plenty  of sleep, and do not smoke. Avoid using alcohol or drugs to relax.  Strong support network. Spend time with family, friends, or other people you enjoy being around.Express your feelings and talk things over with someone you trust.  Counseling or talktherapy with a mental health professional may be helpful if you are having difficulty managing stress on your own. Medicine is typically not recommended for the treatment of stress.Talk to your health care provider if you think you need medicine for symptoms of stress. Follow these instructions at home:  Keep all follow-up visits as directed by your health care provider.  Take all medicines as directed by your health care provider. Contact a health care provider if:  Your symptoms get worse or you start having new symptoms.  You feel overwhelmed by  your problems and can no longer manage them on your own. Get help right away if:  You feel like hurting yourself or someone else. This information is not intended to replace advice given to you by your health care provider. Make sure you discuss any questions you have with your health care provider. Document Released: 12/16/2000 Document Revised: 11/28/2015 Document Reviewed: 02/14/2013 Elsevier Interactive Patient Education  2017 [place].   Use wellbutrin one 2 times a day for the next 3 months to help quit smoking  Use trazadone at night to try to help sleep

## 2017-03-28 ENCOUNTER — Other Ambulatory Visit: Payer: Self-pay | Admitting: Family Medicine

## 2017-03-29 NOTE — Telephone Encounter (Signed)
Last seen 03/23/17 

## 2017-03-29 NOTE — Telephone Encounter (Signed)
This request does not make sense we reordered the medicine just a few days ago please feel free to figure out what's done a make sure those refills were already received no further action on my part required

## 2017-03-30 ENCOUNTER — Telehealth: Payer: Self-pay | Admitting: Family Medicine

## 2017-03-30 MED ORDER — FLUOXETINE HCL 40 MG PO CAPS: ORAL_CAPSULE | ORAL | 5 refills | Status: DC

## 2017-03-30 MED ORDER — LISINOPRIL-HYDROCHLOROTHIAZIDE 10-12.5 MG PO TABS
1.0000 | ORAL_TABLET | Freq: Every day | ORAL | 5 refills | Status: DC
Start: 2017-03-30 — End: 2019-07-27

## 2017-03-30 MED ORDER — BUPROPION HCL ER (SR) 150 MG PO TB12: 150.0000 mg | ORAL_TABLET | Freq: Two times a day (BID) | ORAL | 2 refills | Status: DC

## 2017-03-30 MED ORDER — TRAZODONE HCL 50 MG PO TABS: 25.0000 mg | ORAL_TABLET | Freq: Every evening | ORAL | 3 refills | Status: DC | PRN

## 2017-03-30 NOTE — Telephone Encounter (Signed)
Patient seen Dr. Lorin Picket on 03/23/17.  He had refills sent in for his medications to [facility].  He called and said he uses [facility] and would like this changed.  He is completely out of his meds.

## 2017-03-30 NOTE — Telephone Encounter (Signed)
Prescriptions resent to pharmacy. Patient notified.

## 2017-05-13 ENCOUNTER — Encounter: Payer: Self-pay | Admitting: Family Medicine

## 2017-05-13 ENCOUNTER — Ambulatory Visit: Payer: 59 | Admitting: Family Medicine

## 2017-05-13 VITALS — Temp 99.0°F | Wt 231.0 lb

## 2017-05-13 DIAGNOSIS — J019 Acute sinusitis, unspecified: Secondary | ICD-10-CM | POA: Diagnosis not present

## 2017-05-13 DIAGNOSIS — R509 Fever, unspecified: Secondary | ICD-10-CM | POA: Diagnosis not present

## 2017-05-13 MED ORDER — AMOXICILLIN-POT CLAVULANATE 875-125 MG PO TABS: 1.0000 | ORAL_TABLET | Freq: Two times a day (BID) | ORAL | 0 refills | Status: DC

## 2017-05-13 MED ORDER — PREDNISONE 20 MG PO TABS: ORAL_TABLET | ORAL | 0 refills | Status: DC

## 2017-05-13 MED ORDER — CEFTRIAXONE SODIUM 1 G IJ SOLR
500.0000 mg | Freq: Once | INTRAMUSCULAR | Status: AC
  Administered 2017-05-13: 500 mg via INTRAMUSCULAR

## 2017-05-13 NOTE — Progress Notes (Signed)
   Subjective:    Patient ID: Donald Cain, male    DOB: 07/17/1980, 36 y.o.   MRN: [id number]  Sinus Problem  This is a new problem. The current episode started in the past 7 days. Associated symptoms include congestion, coughing, ear pain, headaches, sinus pressure and a sore throat.   Patient with severe head congestion drainage sinus pressure pain discomfort chest congestion coughing some wheezing no shortness of breath nausea vomiting diarrhea PMH benign smoker he knows he needs to quit   Review of Systems  Constitutional: Negative for activity change and fever.  HENT: Positive for congestion, ear pain, rhinorrhea, sinus pressure and sore throat.   Eyes: Negative for discharge.  Respiratory: Positive for cough. Negative for wheezing.   Cardiovascular: Negative for chest pain.  Neurological: Positive for headaches.       Objective:   Physical Exam  Constitutional: He appears well-developed.  HENT:  Head: Normocephalic.  Mouth/Throat: Oropharynx is clear and moist. No oropharyngeal exudate.  Neck: Normal range of motion.  Cardiovascular: Normal rate, regular rhythm and normal heart sounds.  No murmur heard. Pulmonary/Chest: Effort normal and breath sounds normal. He has no wheezes.  Lymphadenopathy:    He has no cervical adenopathy.  Neurological: He exhibits normal muscle tone.  Skin: Skin is warm and dry.  Nursing note and vitals reviewed.         Assessment & Plan:  Viral syndrome Secondary rhinosinusitis Antibiotics prescribed warning signs discussed follow-up if problems Shot of Rocephin per patient request seems reasonable Prednisone taper albuterol when necessary Encourage patient to quit smoking

## 2017-09-20 ENCOUNTER — Ambulatory Visit: Payer: 59 | Admitting: Family Medicine

## 2018-06-14 ENCOUNTER — Encounter

## 2018-06-14 ENCOUNTER — Ambulatory Visit: Payer: 59 | Admitting: Family Medicine

## 2019-07-25 ENCOUNTER — Other Ambulatory Visit: Payer: Self-pay

## 2019-07-26 NOTE — Progress Notes (Signed)
New Patient Office Visit  Assessment & Plan:  1. Well adult exam - Preventive care education provided.  Patient declined influenza vaccine.  He is up-to-date with Tdap.  2. Essential hypertension - Well controlled on current regimen.  - lisinopril-hydrochlorothiazide (ZESTORETIC) 10-12.5 MG tablet; Take 1 tablet by mouth daily.  Dispense: 90 tablet; Refill: 3 - CBC with Differential/Platelet - CMP14+EGFR - Lipid panel  3. Mixed hyperlipidemia - Assessing for control today.  Education provided on high cholesterol. - Lipid panel  4. Generalized anxiety disorder - Uncontrolled.  Patient started on Prozac 20 mg once daily. - FLUoxetine (PROZAC) 20 MG tablet; Take 1 tablet (20 mg total) by mouth daily.  Dispense: 30 tablet; Refill: 2 - CMP14+EGFR  5. Routine screening for STI (sexually transmitted infection) - Chlamydia/Gonococcus/Trichomonas, NAA - STD Screen (8) - Screening as patient's wife was unfaithful before they separated.   Follow-up: Return in about 6 weeks (around 09/07/2019) for anxiety (telephone).   Hendricks Limes, MSN, APRN, FNP-C [facility]  Subjective:  Patient ID: Shirley Bolle, male    DOB: [birth year]  Age: 39 y.o. MRN: [id number]  Patient Care Team: Loman Brooklyn, FNP as PCP - General (Family Medicine)  CC:  Chief Complaint  Patient presents with  . New Patient (Initial Visit)    HPI Lorn Butcher presents to establish care. He is transferring care from Dr. Murrell Redden office as he has retired and the office has closed.   Patient has been struggling with some increased anxiety.  He reports he previously failed therapy with sertraline.  He does recall that he used to take Prozac and that worked well for him.  He does not recall the dosage.  Depression screen PHQ 2/9 07/27/2019  Decreased Interest 1  Down, Depressed, Hopeless 1  PHQ - 2 Score 2  Altered sleeping 3  Tired, decreased energy 1  Change in appetite 0    Feeling bad or failure about yourself  0  Trouble concentrating 0  Moving slowly or fidgety/restless 0  Suicidal thoughts 0  PHQ-9 Score 6  Difficult doing work/chores Not difficult at all   GAD 7 : Generalized Anxiety Score 07/27/2019  Nervous, Anxious, on Edge 3  Control/stop worrying 3  Worry too much - different things 3  Trouble relaxing 1  Restless 3  Easily annoyed or irritable 3  Afraid - awful might happen 1  Total GAD 7 Score 17  Anxiety Difficulty Somewhat difficult    Review of Systems  Constitutional: Negative for chills, fever, malaise/fatigue and weight loss.  HENT: Negative for congestion, ear discharge, ear pain, nosebleeds, sinus pain, sore throat and tinnitus.   Eyes: Negative for blurred vision, double vision, pain, discharge and redness.  Respiratory: Negative for cough, shortness of breath and wheezing.   Cardiovascular: Negative for chest pain, palpitations and leg swelling.  Gastrointestinal: Negative for abdominal pain, constipation, diarrhea, heartburn, nausea and vomiting.  Genitourinary: Negative for dysuria, frequency and urgency.  Musculoskeletal: Negative for myalgias.  Skin: Negative for rash.  Neurological: Negative for dizziness, seizures, weakness and headaches.  Psychiatric/Behavioral: Negative for depression, substance abuse and suicidal ideas. The patient is nervous/anxious.     Current Outpatient Medications:  .  lisinopril-hydrochlorothiazide (ZESTORETIC) 10-12.5 MG tablet, Take 1 tablet by mouth daily., Disp: 90 tablet, Rfl: 3 .  FLUoxetine (PROZAC) 20 MG tablet, Take 1 tablet (20 mg total) by mouth daily., Disp: 30 tablet, Rfl: 2  No Known Allergies  Past Medical History:  Diagnosis  Date  . Chickenpox   . High blood pressure   . Hyperlipidemia     Past Surgical History:  Procedure Laterality Date  . TONSILLECTOMY      Family History  Problem Relation Age of Onset  . Alcohol abuse Father   . Heart disease Father   . COPD  Father   . Heart disease Mother   . Lung cancer Mother 59  . Epilepsy Sister   . Cancer Paternal Grandmother        Unknown type  . Bipolar disorder Daughter   . Anxiety disorder Daughter     Social History   Socioeconomic History  . Marital status: Divorced    Spouse name: Not on file  . Number of children: 1  . Years of education: Not on file  . Highest education level: Not on file  Occupational History  . Not on file  Tobacco Use  . Smoking status: Current Every Day Smoker    Packs/day: 2.00    Years: 18.00    Pack years: 36.00    Types: Cigarettes  . Smokeless tobacco: Current User    Types: Chew  Substance and Sexual Activity  . Alcohol use: Yes    Comment: occ  . Drug use: No  . Sexual activity: Yes  Other Topics Concern  . Not on file  Social History Narrative  . Not on file   Social Determinants of Health   Financial Resource Strain:   . Difficulty of Paying Living Expenses: Not on file  Food Insecurity:   . Worried About Charity fundraiser in the Last Year: Not on file  . Ran Out of Food in the Last Year: Not on file  Transportation Needs:   . Lack of Transportation (Medical): Not on file  . Lack of Transportation (Non-Medical): Not on file  Physical Activity:   . Days of Exercise per Week: Not on file  . Minutes of Exercise per Session: Not on file  Stress:   . Feeling of Stress : Not on file  Social Connections:   . Frequency of Communication with Friends and Family: Not on file  . Frequency of Social Gatherings with Friends and Family: Not on file  . Attends Religious Services: Not on file  . Active Member of Clubs or Organizations: Not on file  . Attends Archivist Meetings: Not on file  . Marital Status: Not on file  Intimate Partner Violence:   . Fear of Current or Ex-Partner: Not on file  . Emotionally Abused: Not on file  . Physically Abused: Not on file  . Sexually Abused: Not on file    Objective:   Today's Vitals: BP  135/83   Pulse 72   Temp 98.9 F (37.2 C)   Wt 207 lb (93.9 kg)   BMI 28.07 kg/m   Physical Exam Vitals reviewed.  Constitutional:      General: He is not in acute distress.    Appearance: Normal appearance. He is overweight. He is not ill-appearing, toxic-appearing or diaphoretic.  HENT:     Head: Normocephalic and atraumatic.     Right Ear: Tympanic membrane, ear canal and external ear normal. There is no impacted cerumen.     Left Ear: Tympanic membrane, ear canal and external ear normal. There is no impacted cerumen.     Nose: Nose normal. No congestion or rhinorrhea.     Mouth/Throat:     Mouth: Mucous membranes are moist.  Pharynx: Oropharynx is clear. No oropharyngeal exudate or posterior oropharyngeal erythema.  Eyes:     General: No scleral icterus.       Right eye: No discharge.        Left eye: No discharge.     Conjunctiva/sclera: Conjunctivae normal.     Pupils: Pupils are equal, round, and reactive to light.  Cardiovascular:     Rate and Rhythm: Normal rate and regular rhythm.     Heart sounds: Normal heart sounds. No murmur. No friction rub. No gallop.   Pulmonary:     Effort: Pulmonary effort is normal. No respiratory distress.     Breath sounds: Normal breath sounds. No stridor. No wheezing, rhonchi or rales.  Abdominal:     General: Abdomen is flat. Bowel sounds are normal. There is no distension.     Palpations: Abdomen is soft. There is no mass.     Tenderness: There is no abdominal tenderness. There is no guarding or rebound.     Hernia: No hernia is present.  Musculoskeletal:        General: Normal range of motion.     Cervical back: Normal range of motion and neck supple. No rigidity. No muscular tenderness.     Right lower leg: No edema.     Left lower leg: No edema.  Lymphadenopathy:     Cervical: No cervical adenopathy.  Skin:    General: Skin is warm and dry.     Capillary Refill: Capillary refill takes less than 2 seconds.  Neurological:       General: No focal deficit present.     Mental Status: He is alert and oriented to person, place, and time. Mental status is at baseline.  Psychiatric:        Mood and Affect: Mood normal.        Behavior: Behavior normal.        Thought Content: Thought content normal.        Judgment: Judgment normal.

## 2019-07-27 ENCOUNTER — Other Ambulatory Visit: Payer: Self-pay

## 2019-07-27 ENCOUNTER — Ambulatory Visit: Payer: 59 | Admitting: Family Medicine

## 2019-07-27 ENCOUNTER — Encounter: Payer: Self-pay | Admitting: Family Medicine

## 2019-07-27 VITALS — BP 135/83 | HR 72 | Temp 98.9°F | Wt 207.0 lb

## 2019-07-27 DIAGNOSIS — F411 Generalized anxiety disorder: Secondary | ICD-10-CM

## 2019-07-27 DIAGNOSIS — E782 Mixed hyperlipidemia: Secondary | ICD-10-CM | POA: Diagnosis not present

## 2019-07-27 DIAGNOSIS — I10 Essential (primary) hypertension: Secondary | ICD-10-CM | POA: Diagnosis not present

## 2019-07-27 DIAGNOSIS — Z0000 Encounter for general adult medical examination without abnormal findings: Secondary | ICD-10-CM

## 2019-07-27 DIAGNOSIS — Z113 Encounter for screening for infections with a predominantly sexual mode of transmission: Secondary | ICD-10-CM

## 2019-07-27 MED ORDER — LISINOPRIL-HYDROCHLOROTHIAZIDE 10-12.5 MG PO TABS: 1.0000 | ORAL_TABLET | Freq: Every day | ORAL | 3 refills | Status: AC

## 2019-07-27 MED ORDER — FLUOXETINE HCL 20 MG PO TABS: 20.0000 mg | ORAL_TABLET | Freq: Every day | ORAL | 2 refills | Status: DC

## 2019-07-27 NOTE — Patient Instructions (Addendum)
High Cholesterol  High cholesterol is a condition in which the blood has high levels of a white, waxy, fat-like substance (cholesterol). The human body needs small amounts of cholesterol. The liver makes all the cholesterol that the body needs. Extra (excess) cholesterol comes from the food that we eat. Cholesterol is carried from the liver by the blood through the blood vessels. If you have high cholesterol, deposits (plaques) may build up on the walls of your blood vessels (arteries). Plaques make the arteries narrower and stiffer. Cholesterol plaques increase your risk for heart attack and stroke. Work with your health care provider to keep your cholesterol levels in a healthy range. What increases the risk? This condition is more likely to develop in people who:  Eat foods that are high in animal fat (saturated fat) or cholesterol.  Are overweight.  Are not getting enough exercise.  Have a family history of high cholesterol. What are the signs or symptoms? There are no symptoms of this condition. How is this diagnosed? This condition may be diagnosed from the results of a blood test.  If you are older than age 20, your health care provider may check your cholesterol every 4-6 years.  You may be checked more often if you already have high cholesterol or other risk factors for heart disease. The blood test for cholesterol measures:  "Bad" cholesterol (LDL cholesterol). This is the main type of cholesterol that causes heart disease. The desired level for LDL is less than 100.  "Good" cholesterol (HDL cholesterol). This type helps to protect against heart disease by cleaning the arteries and carrying the LDL away. The desired level for HDL is 60 or higher.  Triglycerides. These are fats that the body can store or burn for energy. The desired number for triglycerides is lower than 150.  Total cholesterol. This is a measure of the total amount of cholesterol in your blood, including LDL  cholesterol, HDL cholesterol, and triglycerides. A healthy number is less than 200. How is this treated? This condition is treated with diet changes, lifestyle changes, and medicines. Diet changes  This may include eating more whole grains, fruits, vegetables, nuts, and fish.  This may also include cutting back on red meat and foods that have a lot of added sugar. Lifestyle changes  Changes may include getting at least 40 minutes of aerobic exercise 3 times a week. Aerobic exercises include walking, biking, and swimming. Aerobic exercise along with a healthy diet can help you maintain a healthy weight.  Changes may also include quitting smoking. Medicines  Medicines are usually given if diet and lifestyle changes have failed to reduce your cholesterol to healthy levels.  Your health care provider may prescribe a statin medicine. Statin medicines have been shown to reduce cholesterol, which can reduce the risk of heart disease. Follow these instructions at home: Eating and drinking If told by your health care provider:  Eat chicken (without skin), fish, veal, shellfish, ground turkey breast, and round or loin cuts of red meat.  Do not eat fried foods or fatty meats, such as hot dogs and salami.  Eat plenty of fruits, such as apples.  Eat plenty of vegetables, such as broccoli, potatoes, and carrots.  Eat beans, peas, and lentils.  Eat grains such as barley, rice, couscous, and bulgur wheat.  Eat pasta without cream sauces.  Use skim or nonfat milk, and eat low-fat or nonfat yogurt and cheeses.  Do not eat or drink whole milk, cream, ice cream, egg yolks,   or hard cheeses.  Do not eat stick margarine or tub margarines that contain trans fats (also called partially hydrogenated oils).  Do not eat saturated tropical oils, such as coconut oil and palm oil.  Do not eat cakes, cookies, crackers, or other baked goods that contain trans fats.  General instructions  Exercise as  directed by your health care provider. Increase your activity level with activities such as gardening, walking, and taking the stairs.  Take over-the-counter and prescription medicines only as told by your health care provider.  Do not use any products that contain nicotine or tobacco, such as cigarettes and e-cigarettes. If you need help quitting, ask your health care provider.  Keep all follow-up visits as told by your health care provider. This is important. Contact a health care provider if:  You are struggling to maintain a healthy diet or weight.  You need help to start on an exercise program.  You need help to stop smoking. Get help right away if:  You have chest pain.  You have trouble breathing. This information is not intended to replace advice given to you by your health care provider. Make sure you discuss any questions you have with your health care provider. Document Revised: 06/25/2017 Document Reviewed: 12/21/2015 Elsevier Patient Education  2020 Elsevier Inc.  Preventive Care 21-39 Years Old, Male Preventive care refers to lifestyle choices and visits with your health care provider that can promote health and wellness. This includes:  A yearly physical exam. This is also called an annual well check.  Regular dental and eye exams.  Immunizations.  Screening for certain conditions.  Healthy lifestyle choices, such as eating a healthy diet, getting regular exercise, not using drugs or products that contain nicotine and tobacco, and limiting alcohol use. What can I expect for my preventive care visit? Physical exam Your health care provider will check:  Height and weight. These may be used to calculate body mass index (BMI), which is a measurement that tells if you are at a healthy weight.  Heart rate and blood pressure.  Your skin for abnormal spots. Counseling Your health care provider may ask you questions about:  Alcohol, tobacco, and drug  use.  Emotional well-being.  Home and relationship well-being.  Sexual activity.  Eating habits.  Work and work environment. What immunizations do I need?  Influenza (flu) vaccine  This is recommended every year. Tetanus, diphtheria, and pertussis (Tdap) vaccine  You may need a Td booster every 10 years. Varicella (chickenpox) vaccine  You may need this vaccine if you have not already been vaccinated. Human papillomavirus (HPV) vaccine  If recommended by your health care provider, you may need three doses over 6 months. Measles, mumps, and rubella (MMR) vaccine  You may need at least one dose of MMR. You may also need a second dose. Meningococcal conjugate (MenACWY) vaccine  One dose is recommended if you are 19-21 years old and a first-year college student living in a residence hall, or if you have one of several medical conditions. You may also need additional booster doses. Pneumococcal conjugate (PCV13) vaccine  You may need this if you have certain conditions and were not previously vaccinated. Pneumococcal polysaccharide (PPSV23) vaccine  You may need one or two doses if you smoke cigarettes or if you have certain conditions. Hepatitis A vaccine  You may need this if you have certain conditions or if you travel or work in places where you may be exposed to hepatitis A. Hepatitis B vaccine    You may need this if you have certain conditions or if you travel or work in places where you may be exposed to hepatitis B. Haemophilus influenzae type b (Hib) vaccine  You may need this if you have certain risk factors. You may receive vaccines as individual doses or as more than one vaccine together in one shot (combination vaccines). Talk with your health care provider about the risks and benefits of combination vaccines. What tests do I need? Blood tests  Lipid and cholesterol levels. These may be checked every 5 years starting at age 20.  Hepatitis C  test.  Hepatitis B test. Screening   Diabetes screening. This is done by checking your blood sugar (glucose) after you have not eaten for a while (fasting).  Sexually transmitted disease (STD) testing. Talk with your health care provider about your test results, treatment options, and if necessary, the need for more tests. Follow these instructions at home: Eating and drinking   Eat a diet that includes fresh fruits and vegetables, whole grains, lean protein, and low-fat dairy products.  Take vitamin and mineral supplements as recommended by your health care provider.  Do not drink alcohol if your health care provider tells you not to drink.  If you drink alcohol: ? Limit how much you have to 0-2 drinks a day. ? Be aware of how much alcohol is in your drink. In the [place] one drink equals one 12 oz bottle of beer (355 mL), one 5 oz glass of wine (148 mL), or one 1 oz glass of hard liquor (44 mL). Lifestyle  Take daily care of your teeth and gums.  Stay active. Exercise for at least 30 minutes on 5 or more days each week.  Do not use any products that contain nicotine or tobacco, such as cigarettes, e-cigarettes, and chewing tobacco. If you need help quitting, ask your health care provider.  If you are sexually active, practice safe sex. Use a condom or other form of protection to prevent STIs (sexually transmitted infections). What's next?  Go to your health care provider once a year for a well check visit.  Ask your health care provider how often you should have your eyes and teeth checked.  Stay up to date on all vaccines. This information is not intended to replace advice given to you by your health care provider. Make sure you discuss any questions you have with your health care provider. Document Revised: 06/16/2018 Document Reviewed: 06/16/2018 Elsevier Patient Education  2020 Elsevier Inc.  

## 2019-07-28 LAB — CMP14+EGFR
ALT: 16 IU/L (ref 0–44)
AST: 16 IU/L (ref 0–40)
Albumin/Globulin Ratio: 2.1 (ref 1.2–2.2)
Albumin: 4.4 g/dL (ref 4.0–5.0)
Alkaline Phosphatase: 100 IU/L (ref 39–117)
BUN/Creatinine Ratio: 14 (ref 9–20)
BUN: 16 mg/dL (ref 6–20)
Bilirubin Total: 0.3 mg/dL (ref 0.0–1.2)
CO2: 24 mmol/L (ref 20–29)
Calcium: 10.1 mg/dL (ref 8.7–10.2)
Chloride: 104 mmol/L (ref 96–106)
Creatinine, Ser: 1.11 mg/dL (ref 0.76–1.27)
GFR calc Af Amer: 96 mL/min/{1.73_m2} (ref >59)
GFR calc non Af Amer: 83 mL/min/{1.73_m2} (ref >59)
Globulin, Total: 2.1 g/dL (ref 1.5–4.5)
Glucose: 86 mg/dL (ref 65–99)
Potassium: 4.4 mmol/L (ref 3.5–5.2)
Sodium: 141 mmol/L (ref 134–144)
Total Protein: 6.5 g/dL (ref 6.0–8.5)

## 2019-07-28 LAB — STD SCREEN (8)
HIV Screen 4th Generation wRfx: NONREACTIVE
HSV 1 Glycoprotein G Ab, IgG: <0.91 index (ref 0.00–0.90)
HSV 2 IgG, Type Spec: <0.91 index (ref 0.00–0.90)
Hep A IgM: NEGATIVE
Hep B C IgM: NEGATIVE
Hep C Virus Ab: <0.1 s/co ratio (ref 0.0–0.9)
Hepatitis B Surface Ag: NEGATIVE
RPR Ser Ql: NONREACTIVE

## 2019-07-28 LAB — CHLAMYDIA/GONOCOCCUS/TRICHOMONAS, NAA
Chlamydia by NAA: NEGATIVE
Gonococcus by NAA: NEGATIVE
Trich vag by NAA: NEGATIVE

## 2019-07-28 LAB — LIPID PANEL
Chol/HDL Ratio: 4.5 ratio (ref 0.0–5.0)
Cholesterol, Total: 203 mg/dL — ABNORMAL HIGH (ref 100–199)
HDL: 45 mg/dL (ref >39)
LDL Chol Calc (NIH): 143 mg/dL — ABNORMAL HIGH (ref 0–99)
Triglycerides: 82 mg/dL (ref 0–149)
VLDL Cholesterol Cal: 15 mg/dL (ref 5–40)

## 2019-07-28 LAB — CBC WITH DIFFERENTIAL/PLATELET
Basophils Absolute: 0.1 10*3/uL (ref 0.0–0.2)
Basos: 1 %
EOS (ABSOLUTE): 0.6 10*3/uL — ABNORMAL HIGH (ref 0.0–0.4)
Eos: 6 %
Hematocrit: 50.8 % (ref 37.5–51.0)
Hemoglobin: 17.2 g/dL (ref 13.0–17.7)
Immature Grans (Abs): 0 10*3/uL (ref 0.0–0.1)
Immature Granulocytes: 0 %
Lymphocytes Absolute: 2.2 10*3/uL (ref 0.7–3.1)
Lymphs: 22 %
MCH: 31.4 pg (ref 26.6–33.0)
MCHC: 33.9 g/dL (ref 31.5–35.7)
MCV: 93 fL (ref 79–97)
Monocytes Absolute: 0.9 10*3/uL (ref 0.1–0.9)
Monocytes: 9 %
Neutrophils Absolute: 6.3 10*3/uL (ref 1.4–7.0)
Neutrophils: 62 %
Platelets: 308 10*3/uL (ref 150–450)
RBC: 5.47 x10E6/uL (ref 4.14–5.80)
RDW: 11.7 % (ref 11.6–15.4)
WBC: 10.1 10*3/uL (ref 3.4–10.8)

## 2019-07-30 ENCOUNTER — Encounter: Payer: Self-pay | Admitting: Family Medicine

## 2019-07-30 DIAGNOSIS — E785 Hyperlipidemia, unspecified: Secondary | ICD-10-CM | POA: Insufficient documentation

## 2019-07-31 ENCOUNTER — Telehealth: Payer: Self-pay | Admitting: *Deleted

## 2019-07-31 DIAGNOSIS — F411 Generalized anxiety disorder: Secondary | ICD-10-CM

## 2019-07-31 MED ORDER — FLUOXETINE HCL 20 MG PO CAPS: 20.0000 mg | ORAL_CAPSULE | Freq: Every day | ORAL | 2 refills | Status: DC

## 2019-07-31 NOTE — Addendum Note (Signed)
Addended by: Gwenlyn Fudge on: 07/31/2019 04:10 PM   Modules accepted: Orders

## 2019-07-31 NOTE — Telephone Encounter (Signed)
Fax from [facility] Fluoxetine 20 mg tablet Not covered Please send new Rx for capsules, if appropriate to change

## 2019-09-07 ENCOUNTER — Encounter: Payer: Self-pay | Admitting: Family Medicine

## 2019-09-07 ENCOUNTER — Ambulatory Visit (INDEPENDENT_AMBULATORY_CARE_PROVIDER_SITE_OTHER): Payer: BC Managed Care – PPO | Admitting: Family Medicine

## 2019-09-07 DIAGNOSIS — F411 Generalized anxiety disorder: Secondary | ICD-10-CM | POA: Diagnosis not present

## 2019-09-07 MED ORDER — FLUOXETINE HCL 20 MG PO CAPS: 20.0000 mg | ORAL_CAPSULE | Freq: Every day | ORAL | 2 refills | Status: AC

## 2019-09-07 NOTE — Progress Notes (Signed)
   Virtual Visit via Telephone Note  I connected with Donald Cain on 09/07/19 at 9:05 AM by telephone and verified that I am speaking with the correct person using two identifiers. Ricahrd Deaunte Cain is currently located at work and nobody is currently with him during this visit. The provider, Gwenlyn Fudge, FNP is located in their home at time of visit.  I discussed the limitations, risks, security and privacy concerns of performing an evaluation and management service by telephone and the availability of in person appointments. I also discussed with the patient that there may be a patient responsible charge related to this service. The patient expressed understanding and agreed to proceed.  Subjective: PCP: Gwenlyn Fudge, FNP  Chief Complaint  Patient presents with  . Anxiety   Patient is following up on his anxiety.  He was started on Prozac 20 mg once daily on 07/27/2019.  He reports so far it is going good.  He does not feel nearly as jittery as he was.  He does not feel he needs a dosage change.  GAD 7 : Generalized Anxiety Score 09/07/2019 07/27/2019  Nervous, Anxious, on Edge 1 3  Control/stop worrying 0 3  Worry too much - different things 1 3  Trouble relaxing 0 1  Restless 0 3  Easily annoyed or irritable 0 3  Afraid - awful might happen 1 1  Total GAD 7 Score 3 17  Anxiety Difficulty Not difficult at all Somewhat difficult    ROS: Per HPI  Current Outpatient Medications:  .  FLUoxetine (PROZAC) 20 MG capsule, Take 1 capsule (20 mg total) by mouth daily., Disp: 90 capsule, Rfl: 2 .  lisinopril-hydrochlorothiazide (ZESTORETIC) 10-12.5 MG tablet, Take 1 tablet by mouth daily., Disp: 90 tablet, Rfl: 3  No Known Allergies Past Medical History:  Diagnosis Date  . Chickenpox   . High blood pressure   . Hyperlipidemia     Observations/Objective: A&O  No respiratory distress or wheezing audible over the phone Mood, judgement, and thought processes all  WNL   Assessment and Plan: 1. Generalized anxiety disorder - Well controlled on current regimen.  - FLUoxetine (PROZAC) 20 MG capsule; Take 1 capsule (20 mg total) by mouth daily.  Dispense: 90 capsule; Refill: 2   Follow Up Instructions: Return on/after 07/26/20, for annual physical.  I discussed the assessment and treatment plan with the patient. The patient was provided an opportunity to ask questions and all were answered. The patient agreed with the plan and demonstrated an understanding of the instructions.   The patient was advised to call back or seek an in-person evaluation if the symptoms worsen or if the condition fails to improve as anticipated.  The above assessment and management plan was discussed with the patient. The patient verbalized understanding of and has agreed to the management plan. Patient is aware to call the clinic if symptoms persist or worsen. Patient is aware when to return to the clinic for a follow-up visit. Patient educated on when it is appropriate to go to the emergency department.   Time call ended: 9:11 AM  I provided 8 minutes of non-face-to-face time during this encounter.  Deliah Boston, MSN, APRN, FNP-C [facility] 09/07/19

## 2022-06-26 ENCOUNTER — Telehealth: Payer: Self-pay

## 2022-06-26 NOTE — Telephone Encounter (Signed)
Transition Care Management Unsuccessful Follow-up Telephone Call  Date of discharge and from where:  06/25/2022 [facility]  Attempts:  1st Attempt  Reason for unsuccessful TCM follow-up call:  Unable to reach patient - called # in chart person answered advised that I had wrong number
